# Patient Record
Sex: Male | Born: 1990 | Race: Black or African American | Hispanic: No | Marital: Single | Smoking: Never smoker
Health system: Southern US, Community
[De-identification: ages and names within clinical notes are randomized; demographics above are authoritative.]

## PROBLEM LIST (undated history)

## (undated) HISTORY — PX: HAND SURGERY: SHX662

---

## 2015-04-05 ENCOUNTER — Emergency Department (HOSPITAL_BASED_OUTPATIENT_CLINIC_OR_DEPARTMENT_OTHER): Payer: Self-pay

## 2015-04-05 ENCOUNTER — Emergency Department (HOSPITAL_BASED_OUTPATIENT_CLINIC_OR_DEPARTMENT_OTHER)
Admission: EM | Admit: 2015-04-05 | Discharge: 2015-04-05 | Disposition: A | Discharge status: Home / Self Care | Payer: Self-pay | Attending: Emergency Medicine | Admitting: Emergency Medicine

## 2015-04-05 ENCOUNTER — Encounter (HOSPITAL_BASED_OUTPATIENT_CLINIC_OR_DEPARTMENT_OTHER): Payer: Self-pay | Admitting: *Deleted

## 2015-04-05 DIAGNOSIS — Y9367 Activity, basketball: Secondary | ICD-10-CM | POA: Insufficient documentation

## 2015-04-05 DIAGNOSIS — Y92310 Basketball court as the place of occurrence of the external cause: Secondary | ICD-10-CM | POA: Insufficient documentation

## 2015-04-05 DIAGNOSIS — S99922A Unspecified injury of left foot, initial encounter: Secondary | ICD-10-CM | POA: Insufficient documentation

## 2015-04-05 DIAGNOSIS — S93401A Sprain of unspecified ligament of right ankle, initial encounter: Secondary | ICD-10-CM | POA: Insufficient documentation

## 2015-04-05 DIAGNOSIS — Y998 Other external cause status: Secondary | ICD-10-CM | POA: Insufficient documentation

## 2015-04-05 DIAGNOSIS — W1839XA Other fall on same level, initial encounter: Secondary | ICD-10-CM | POA: Insufficient documentation

## 2015-04-05 NOTE — Discharge Instructions (Signed)
Ankle Sprain °An ankle sprain is an injury to the strong, fibrous tissues (ligaments) that hold your ankle bones together.  °HOME CARE  °· Put ice on your ankle for 1-2 days or as told by your doctor. °¨ Put ice in a plastic bag. °¨ Place a towel between your skin and the bag. °¨ Leave the ice on for 15-20 minutes at a time, every 2 hours while you are awake. °· Only take medicine as told by your doctor. °· Raise (elevate) your injured ankle above the level of your heart as much as possible for 2-3 days. °· Use crutches if your doctor tells you to. Slowly put your own weight on the affected ankle. Use the crutches until you can walk without pain. °· If you have a plaster splint: °¨ Do not rest it on anything harder than a pillow for 24 hours. °¨ Do not put weight on it. °¨ Do not get it wet. °¨ Take it off to shower or bathe. °· If given, use an elastic wrap or support stocking for support. Take the wrap off if your toes lose feeling (numb), tingle, or turn cold or blue. °· If you have an air splint: °¨ Add or let out air to make it comfortable. °¨ Take it off at night and to shower and bathe. °¨ Wiggle your toes and move your ankle up and down often while you are wearing it. °GET HELP IF: °· You have rapidly increasing bruising or puffiness (swelling). °· Your toes feel very cold. °· You lose feeling in your foot. °· Your medicine does not help your pain. °GET HELP RIGHT AWAY IF:  °· Your toes lose feeling (numb) or turn blue. °· You have severe pain that is increasing. °MAKE SURE YOU:  °· Understand these instructions. °· Will watch your condition. °· Will get help right away if you are not doing well or get worse. °  °This information is not intended to replace advice given to you by your health care provider. Make sure you discuss any questions you have with your health care provider. °  °Document Released: 11/11/2007 Document Revised: 06/15/2014 Document Reviewed: 12/07/2011 °Elsevier Interactive Patient  Education ©2016 [employer] ° °

## 2015-04-05 NOTE — ED Notes (Signed)
Right foot injury playing basketball 2 hours PTA.

## 2015-04-05 NOTE — ED Provider Notes (Signed)
CSN: [id number]     Arrival date & time 04/05/15  1959 History  By signing my name below, I, Netta Corrigan, attest that this documentation has been prepared under the direction and in the presence of Linwood Dibbles, MD .  Electronically Signed: Netta Corrigan, ED Scribe. 04/05/2015. 9:05 PM.    Chief Complaint  Patient presents with  . Foot Injury   The history is provided by the patient. No language interpreter was used.   HPI Comments: Robert Oneill is a 24 y.o. male who presents to the Emergency Department complaining of constant, gradual onset, moderate pain in his left ankle and left foot beginning two hours PTA. Pt states that the pain worsens when walking. He has not tried any treatments. Pt reports he was playing basketball, jumped and landed on his right foot. He also notes that another player also landed on his foot after he fell. He denies numbness.   History reviewed. No pertinent past medical history. History reviewed. No pertinent past surgical history. History reviewed. No pertinent family history. Social History  Substance Use Topics  . Smoking status: Never Smoker   . Smokeless tobacco: None  . Alcohol Use: No    Review of Systems  Musculoskeletal: Positive for arthralgias.  Neurological: Negative for numbness.  All other systems reviewed and are negative.  Allergies  Codeine  Home Medications   Prior to Admission medications   Not on File   BP 124/77 mmHg  Pulse 92  Temp(Src) 98.6 F (37 C) (Oral)  Resp 20  Ht  (1.778 m)  Wt 145 lb (65.772 kg)  BMI 20.81 kg/m2  SpO2 99% Physical Exam  Constitutional: He appears well-developed and well-nourished. No distress.  HENT:  Head: Normocephalic and atraumatic.  Right Ear: External ear normal.  Left Ear: External ear normal.  Eyes: Conjunctivae are normal. Right eye exhibits no discharge. Left eye exhibits no discharge. No scleral icterus.  Neck: Neck supple. No tracheal deviation present.   Cardiovascular: Normal rate.   Pulmonary/Chest: Effort normal. No stridor. No respiratory distress.  Musculoskeletal: He exhibits no edema.       Right ankle: No lateral malleolus, no medial malleolus, no head of 5th metatarsal and no proximal fibula tenderness found.       Right foot: There is tenderness. There is no bony tenderness and no deformity.  Tenderness along Talofibular Ligament  Neurological: He is alert. Cranial nerve deficit: no gross deficits.  Skin: Skin is warm and dry. No rash noted.  Psychiatric: He has a normal mood and affect.  Nursing note and vitals reviewed.   ED Course  Procedures DIAGNOSTIC STUDIES: Oxygen Saturation is 100% on RA, normal by my interpretation.    9:04 PM COORDINATION OF CARE: Discussed treatment plan with pt. Pt agreed to plan.    Imaging Review Dg Foot Complete Right  04/05/2015  CLINICAL DATA:  Pain following injury while playing basketball EXAM: RIGHT FOOT COMPLETE - 3+ VIEW COMPARISON:  None. FINDINGS: Frontal, oblique, and lateral views were obtained. There is no fracture or dislocation. Joint spaces appear intact. No erosive change. IMPRESSION: No fracture or dislocation.  No appreciable arthropathy. Electronically Signed   By: Bretta Bang III M.D.   On: 04/05/2015 20:26   I have personally reviewed and evaluated these images results as part of my medical decision-making.    MDM   Final diagnoses:  Ankle sprain, right, initial encounter   Soft tissue ankle sprain.  No fx.  Splint, crutches.  Follow  up PRN or if sx do not resolve as discussed.  I personally performed the services described in this documentation, which was scribed in my presence.  The recorded information has been reviewed and is accurate.     Linwood DibblesJon Niti Leisure, MD 04/09/15 [phone]

## 2017-12-05 ENCOUNTER — Emergency Department (HOSPITAL_COMMUNITY)
Admission: EM | Admit: 2017-12-05 | Discharge: 2017-12-05 | Disposition: A | Discharge status: Home / Self Care | Payer: Self-pay | Attending: Emergency Medicine | Admitting: Emergency Medicine

## 2017-12-05 DIAGNOSIS — W57XXXA Bitten or stung by nonvenomous insect and other nonvenomous arthropods, initial encounter: Secondary | ICD-10-CM | POA: Insufficient documentation

## 2017-12-05 DIAGNOSIS — S1086XA Insect bite of other specified part of neck, initial encounter: Secondary | ICD-10-CM | POA: Insufficient documentation

## 2017-12-05 DIAGNOSIS — Y999 Unspecified external cause status: Secondary | ICD-10-CM | POA: Insufficient documentation

## 2017-12-05 DIAGNOSIS — R21 Rash and other nonspecific skin eruption: Secondary | ICD-10-CM

## 2017-12-05 DIAGNOSIS — S20469A Insect bite (nonvenomous) of unspecified back wall of thorax, initial encounter: Secondary | ICD-10-CM | POA: Insufficient documentation

## 2017-12-05 DIAGNOSIS — Y929 Unspecified place or not applicable: Secondary | ICD-10-CM | POA: Insufficient documentation

## 2017-12-05 DIAGNOSIS — S0086XA Insect bite (nonvenomous) of other part of head, initial encounter: Secondary | ICD-10-CM | POA: Insufficient documentation

## 2017-12-05 DIAGNOSIS — Y939 Activity, unspecified: Secondary | ICD-10-CM | POA: Insufficient documentation

## 2017-12-05 MED ORDER — DIPHENHYDRAMINE HCL 25 MG PO CAPS
25.0000 mg | ORAL_CAPSULE | Freq: Once | ORAL | Status: AC
  Administered 2017-12-05: 25 mg via ORAL
  Filled 2017-12-05: qty 1

## 2017-12-05 NOTE — ED Triage Notes (Signed)
Pt has rash/lumps versus bites to upper back and forehead. They itch except for the ones on the forehead.

## 2017-12-05 NOTE — ED Provider Notes (Signed)
[facility] EMERGENCY DEPARTMENT Provider Note   CSN: [id number] Arrival date & time: 12/05/17  4782     History   Chief Complaint Chief Complaint  Patient presents with  . Rash    HPI Robert Oneill is a 27 y.o. male.  Robert Oneill is a 27 y.o. Male who is otherwise healthy, presents for evaluation of itchy bumps over the forehead and upper back.  He reports these have been present for 2 to 3 days, over the past 2 days he has noticed 2 new bumps.  He reports all of them are itchy, but not as itchy as when he had scabies before.  Patient reports he has not noticed any over his arms legs or trunk.  They are minimally painful from scratching.  He has not noticed any drainage from the areas.  Denies fevers chills, generalized fatigue, myalgias, headache, nausea or vomiting.  Patient reports no one else in the home has these bites, but when he first noticed them he had been sleeping over at a friend's house.  Concern today may be bug bites but is not sure what he was bit by.  Has not been out in the woods or outdoors a lot recently.      No past medical history on file.  There are no active problems to display for this patient.   No past surgical history on file.      Home Medications    Prior to Admission medications   Not on File    Family History No family history on file.  Social History Social History   Tobacco Use  . Smoking status: Never Smoker  Substance Use Topics  . Alcohol use: No  . Drug use: No     Allergies   Codeine   Review of Systems Review of Systems  Constitutional: Negative for chills and fever.  Gastrointestinal: Negative for nausea and vomiting.  Musculoskeletal: Negative for myalgias.  Skin: Positive for rash.  Neurological: Negative for headaches.     Physical Exam Updated Vital Signs BP 118/68 (BP Location: Right Arm)   Pulse 92   Temp 98.4 F (36.9 C) (Oral)   Resp 19   Ht 5\' 10"  (1.778 m)   Wt 63.5  kg (140 lb)   SpO2 96%   BMI 20.09 kg/m   Physical Exam  Constitutional: He appears well-developed and well-nourished. No distress.  HENT:  Head: Normocephalic and atraumatic.  Eyes: Right eye exhibits no discharge. Left eye exhibits no discharge.  Pulmonary/Chest: Effort normal. No respiratory distress.  Neurological: He is alert. Coordination normal.  Skin: Skin is warm and dry. He is not diaphoretic.  Small raised bumps over the forehead, upper neck and back, there are not erythematous lesions are not vesicular or pustular, no expressible drainage, no petechiae, nontender to palpation, no fluctuance, no surrounding erythema or induration  Psychiatric: He has a normal mood and affect. His behavior is normal.  Nursing note and vitals reviewed.    ED Treatments / Results  Labs (all labs ordered are listed, but only abnormal results are displayed) Labs Reviewed - No data to display  EKG None  Radiology No results found.  Procedures Procedures (including critical care time)  Medications Ordered in ED Medications  diphenhydrAMINE (BENADRYL) capsule 25 mg (25 mg Oral Given 12/05/17 1000)     Initial Impression / Assessment and Plan / ED Course  I have reviewed the triage vital signs and the nursing notes.  Pertinent labs &  imaging results that were available during my care of the patient were reviewed by me and considered in my medical decision making (see chart for details).  Rash consistent with insect bites. Patient denies any difficulty breathing or swallowing.  Pt has a patent airway without stridor and is handling secretions without difficulty; no angioedema. No blisters, no pustules, no warmth, no draining sinus tracts, no superficial abscesses, no bullous impetigo, no vesicles, no desquamation, no target lesions with dusky purpura or a central bulla. Not tender to touch. No concern for superimposed infection. No concern for SJS, TEN, TSS, tick borne illness, syphilis or  other life-threatening condition.  Patient these are likely insect bites, may be bug since they occurred when he was sleeping in someone else's house.  Encourage patient to continue with hydrocortisone cream, Benadryl or Zyrtec as needed for pruritus.  Patient follow-up with his primary doctor if it does not improve.  Return precautions discussed.  Patient expresses understanding and is in agreement with plan.    Final Clinical Impressions(s) / ED Diagnoses   Final diagnoses:  Rash  Multiple insect bites    ED Discharge Orders    None       Dartha LodgeFord, Metta Koranda N, [place] JerseyPA-C 12/05/17 1003    Shaune PollackIsaacs, [place], MD 12/05/17 1547

## 2017-12-05 NOTE — Discharge Instructions (Signed)
This rash may be caused by some type of insect, you can continue using hydrocortisone cream and take Benadryl or Zyrtec to help with itching.  You can also use cool compresses over the area.  If they continue to show up or do not improve please follow-up with your regular doctor.  Return to the emergency department if they become larger red and swollen and begin draining fluid, you develop fevers or any other new or concerning symptoms.  As we discussed there is possibility this could be bedbugs given that your away and not in your home when this happened, this can only really be confirmed by a pest control evaluation at your friend's house where this started.

## 2019-01-09 ENCOUNTER — Encounter (HOSPITAL_BASED_OUTPATIENT_CLINIC_OR_DEPARTMENT_OTHER): Payer: Self-pay

## 2019-01-09 ENCOUNTER — Other Ambulatory Visit: Payer: Self-pay

## 2019-01-09 ENCOUNTER — Emergency Department (HOSPITAL_BASED_OUTPATIENT_CLINIC_OR_DEPARTMENT_OTHER): Payer: No Typology Code available for payment source

## 2019-01-09 ENCOUNTER — Emergency Department (HOSPITAL_BASED_OUTPATIENT_CLINIC_OR_DEPARTMENT_OTHER)
Admission: EM | Admit: 2019-01-09 | Discharge: 2019-01-09 | Disposition: A | Discharge status: Home / Self Care | Payer: No Typology Code available for payment source | Attending: Emergency Medicine | Admitting: Emergency Medicine

## 2019-01-09 DIAGNOSIS — S61411A Laceration without foreign body of right hand, initial encounter: Secondary | ICD-10-CM | POA: Diagnosis not present

## 2019-01-09 DIAGNOSIS — Y92410 Unspecified street and highway as the place of occurrence of the external cause: Secondary | ICD-10-CM | POA: Diagnosis not present

## 2019-01-09 DIAGNOSIS — S51812A Laceration without foreign body of left forearm, initial encounter: Secondary | ICD-10-CM | POA: Diagnosis not present

## 2019-01-09 DIAGNOSIS — S6991XA Unspecified injury of right wrist, hand and finger(s), initial encounter: Secondary | ICD-10-CM | POA: Diagnosis present

## 2019-01-09 DIAGNOSIS — Y9389 Activity, other specified: Secondary | ICD-10-CM | POA: Insufficient documentation

## 2019-01-09 DIAGNOSIS — Y999 Unspecified external cause status: Secondary | ICD-10-CM | POA: Diagnosis not present

## 2019-01-09 DIAGNOSIS — S61219A Laceration without foreign body of unspecified finger without damage to nail, initial encounter: Secondary | ICD-10-CM | POA: Insufficient documentation

## 2019-01-09 MED ORDER — CEPHALEXIN 250 MG PO CAPS
500.0000 mg | ORAL_CAPSULE | Freq: Once | ORAL | Status: AC
Start: 2019-01-09 — End: 2019-01-09
  Administered 2019-01-09: 15:00:00 500 mg via ORAL
  Filled 2019-01-09: qty 2

## 2019-01-09 MED ORDER — TRAMADOL HCL 50 MG PO TABS
50.0000 mg | ORAL_TABLET | Freq: Once | ORAL | Status: AC
  Administered 2019-01-09: 50 mg via ORAL
  Filled 2019-01-09: qty 1

## 2019-01-09 MED ORDER — LIDOCAINE-EPINEPHRINE (PF) 2 %-1:200000 IJ SOLN
INTRAMUSCULAR | Status: AC
  Filled 2019-01-09: qty 10

## 2019-01-09 MED ORDER — CEPHALEXIN 500 MG PO CAPS: 500.0000 mg | ORAL_CAPSULE | Freq: Three times a day (TID) | ORAL | 0 refills | Status: AC

## 2019-01-09 MED ORDER — LIDOCAINE-EPINEPHRINE (PF) 2 %-1:200000 IJ SOLN
20.0000 mL | Freq: Once | INTRAMUSCULAR | Status: AC
  Administered 2019-01-09: 20 mL
  Filled 2019-01-09: qty 20

## 2019-01-09 NOTE — Discharge Instructions (Addendum)
It was our pleasure to provide your ER care today - we hope that you feel better.  Keep wounds very clean and dry.   Take ibuprofen or acetaminophen as need for pain.  Take keflex (antibiotic) to help prevent wound infection.  Follow up with primary care doctor, or urgent care, in 10-12 days for suture removal.  Return to ER if worse, new symptoms, fevers, infection of wound, pus, spreading redness, other concern.
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
## 2022-09-21 ENCOUNTER — Other Ambulatory Visit: Payer: Self-pay

## 2022-09-21 ENCOUNTER — Encounter (HOSPITAL_BASED_OUTPATIENT_CLINIC_OR_DEPARTMENT_OTHER): Payer: Self-pay | Admitting: Emergency Medicine

## 2022-09-21 ENCOUNTER — Emergency Department (HOSPITAL_BASED_OUTPATIENT_CLINIC_OR_DEPARTMENT_OTHER)
Admission: EM | Admit: 2022-09-21 | Discharge: 2022-09-21 | Disposition: A | Discharge status: Home / Self Care | Payer: BLUE CROSS/BLUE SHIELD | Attending: Emergency Medicine | Admitting: Emergency Medicine

## 2022-09-21 ENCOUNTER — Emergency Department (HOSPITAL_BASED_OUTPATIENT_CLINIC_OR_DEPARTMENT_OTHER): Payer: BLUE CROSS/BLUE SHIELD

## 2022-09-21 DIAGNOSIS — M79641 Pain in right hand: Secondary | ICD-10-CM | POA: Diagnosis present

## 2022-09-21 DIAGNOSIS — S62356A Nondisplaced fracture of shaft of fifth metacarpal bone, right hand, initial encounter for closed fracture: Secondary | ICD-10-CM | POA: Insufficient documentation

## 2022-09-21 DIAGNOSIS — W0110XA Fall on same level from slipping, tripping and stumbling with subsequent striking against unspecified object, initial encounter: Secondary | ICD-10-CM | POA: Diagnosis not present

## 2022-09-21 DIAGNOSIS — Y9367 Activity, basketball: Secondary | ICD-10-CM | POA: Diagnosis not present

## 2022-09-21 MED ORDER — OXYCODONE HCL 5 MG PO TABS: 5.0000 mg | ORAL_TABLET | ORAL | 0 refills | Status: AC | PRN

## 2022-09-21 MED ORDER — KETOROLAC TROMETHAMINE 15 MG/ML IJ SOLN
15.0000 mg | Freq: Once | INTRAMUSCULAR | Status: AC
  Administered 2022-09-21: 15 mg via INTRAMUSCULAR
  Filled 2022-09-21: qty 1

## 2022-09-21 NOTE — ED Provider Notes (Signed)
[place] [facility] AT [facility] Provider Note   CSN: [id number] Arrival date & time: 09/21/22  1752     History Chief Complaint  Patient presents with   Hand Injury    right    Robert Oneill is a 32 y.o. male reportedly otherwise healthy presents to the emergency department for evaluation of his right hand pain.  Patient reports that he was was playing basketball and fell hitting his hand on the concrete.  He reports the incident happened on Friday.  He reports he was fine but started having some more pain last night.  He is right-hand dominant.  He has had surgery on this hand previously before.  Denies any numbness or tingling.  He denies hitting his head or any other injury.  Denies any other pain or injury to the extremity.  No blood thinner use.  Allergic to codeine.   Hand Injury Associated symptoms: no fever        Home Medications Prior to Admission medications   Medication Sig Start Date End Date Taking? Authorizing Provider  oxyCODONE (ROXICODONE) 5 MG immediate release tablet Take 1 tablet (5 mg total) by mouth every 4 (four) hours as needed for severe pain. 09/21/22  Yes Achille Rich, PA-C  cephALEXin (KEFLEX) 500 MG capsule Take 1 capsule (500 mg total) by mouth 3 (three) times daily. 01/09/19   Cathren Laine, MD      Allergies    Codeine    Review of Systems   Review of Systems  Constitutional:  Negative for chills and fever.  Musculoskeletal:  Positive for arthralgias and myalgias.    Physical Exam Updated Vital Signs BP 109/74 (BP Location: Left Arm)   Pulse (!) 109   Temp 98.2 F (36.8 C) (Oral)   Resp 18   Wt 61.2 kg   SpO2 98%   BMI 19.37 kg/m  Physical Exam Vitals and nursing note reviewed.  Constitutional:      Appearance: Normal appearance.  Eyes:     General: No scleral icterus. Pulmonary:     Effort: Pulmonary effort is normal. No respiratory distress.  Musculoskeletal:        General: Tenderness present.      Comments: Tenderness to the ulnar aspect of the right hand.  Old surgical scar present.  Tattoos present.  Otherwise, no abrasions, lacerations, fluctuance or any induration present.  Cap refill is brisk in all 5 fingers.  Palpable radial pulses.  Full flexion extension of the wrist and elbow without pain.  Patient is able to make a fist however his pinky does stick out in a flexed position at the PIP joint which he reports is chronic for him since his last surgery.  No snuffbox tenderness.  Sensation reportedly intact.  Skin:    General: Skin is dry.     Findings: No rash.  Neurological:     General: No focal deficit present.     Mental Status: He is alert. Mental status is at baseline.  Psychiatric:        Mood and Affect: Mood normal.     ED Results / Procedures / Treatments   Labs (all labs ordered are listed, but only abnormal results are displayed) Labs Reviewed - No data to display  EKG None  Radiology DG Hand Complete Right  Result Date: 09/21/2022 CLINICAL DATA:  Patient states injury playing basketball 3 days ago. Also broke that hand 18 months ago. EXAM: RIGHT HAND - COMPLETE 3+ VIEW COMPARISON:  Right hand radiographs 01/09/2019 FINDINGS: There is a nondisplaced acute transverse fracture in the midshaft of the fifth metacarpal. There is some mild bony deformity in the distal aspect of the fifth metacarpal and curvature of the bone which may related to remote prior injury. No evidence of dislocation. No additional acute findings. There is regional soft tissue swelling. IMPRESSION: 1. Nondisplaced acute transverse fracture in the midshaft of the right fifth metacarpal. 2. Mild bony deformity of the distal fifth metacarpal and curvature of the bone may be related to remote prior injury. Radiographs of the old fifth metacarpal fracture are not available for comparison. Electronically Signed   By: Emmaline Kluver M.D.   On: 09/21/2022 18:29    Procedures Procedures   Medications  Ordered in ED Medications  ketorolac (TORADOL) 15 MG/ML injection 15 mg (has no administration in time range)    ED Course/ Medical Decision Making/ A&P Clinical Course as of 09/21/22 2045  Mon Sep 21, 2022  2037 DG Hand Complete Right [RR]    Clinical Course User Index [RR] Achille Rich, PA-C    Medical Decision Making Amount and/or Complexity of Data Reviewed Radiology: ordered. Decision-making details documented in ED Course.  Risk Prescription drug management.   33 y.o. male presents to the ER today for evaluation of right hand pain. Differential diagnosis includes but is not limited to fracture, dislocation, contusion, tendon ligament injury, sprain, strain. Vital signs show mild tachycardia otherwise unremarkable. Physical exam as noted above.   X-ray of the right hand shows  1. Nondisplaced acute transverse fracture in the midshaft of the right fifth metacarpal. 2. Mild bony deformity of the distal fifth metacarpal and curvature of the bone may be related to remote prior injury. Radiographs of the old fifth metacarpal fracture are not available for comparison.  Patient already has a Hydrographic surveyor, Dr. Doroteo Glassman that he follows up with.  Will place him in a ulnar gutter and have him follow-up with him.  There is no signs of any open fracture.  He has brisk cap refill and palpable pulses.  Toradol shot given.  Will send him home with same short course of narcotic pain medication for breakthrough pain.  We discussed plan at bedside including cast care, pain management, and the need to follow-up with hand surgery. We discussed strict return precautions and red flag symptoms. The patient verbalized their understanding and agrees to the plan. The patient is stable and being discharged home in good condition.  Portions of this report may have been transcribed using voice recognition software. Every effort was made to ensure accuracy; however, inadvertent computerized transcription errors  may be present.   Final Clinical Impression(s) / ED Diagnoses Final diagnoses:  Nondisplaced fracture of shaft of fifth metacarpal bone, right hand, initial encounter for closed fracture    Rx / DC Orders ED Discharge Orders          Ordered    oxyCODONE (ROXICODONE) 5 MG immediate release tablet  Every 4 hours PRN        09/21/22 2040              Achille Rich, PA-C 09/21/22 2048    Ernie Avena, MD 09/22/22 [phone]

## 2022-09-21 NOTE — ED Triage Notes (Signed)
Right hand injury while playing basketball, swelling noted , Hx surgery same hand .

## 2022-09-21 NOTE — Discharge Instructions (Addendum)
You were seen in the ER today for evaluation of your right hand pain.  You do have a fracture in the same bone measure previous 1.  We have placed you in a splint.  Please make sure you call your hand surgeon tomorrow to schedule appointment.  I recommend taking 1000 mg of Tylenol and 600 mg of ibuprofen every 6 hours as needed for pain.  I prescribed you a few narcotic pain medications he can take for breakthrough pain.  Please do not drive or operate heavy machinery on this medication as can make you sleepy.  I included more information on cast and splint care to the discharge paperwork.  If you have any concerns, new or worsening symptoms, please return to the nearest emergency department for evaluation.  Contact a doctor if: The skin around the cast or splint gets red or raw. The skin under the cast is very itchy or painful. Your cast or splint: Gets damaged. Feels very uncomfortable. Is too tight or too loose. Your cast becomes wet or it starts to have a soft spot or area. There is a bad smell coming from under your cast. You get an object stuck under your cast. Get help right away if: You get any symptoms of compartment syndrome, such as: Very bad pain or pressure under the cast. Numbness, tingling, coldness, or pale or bluish skin. The part of your body above or below the cast is swollen, and it turns a different color (is discolored). You cannot feel or move your fingers or toes. Your pain gets worse. There is fluid leaking through the cast. You have trouble breathing or shortness of breath. You have chest pain. These symptoms may be an emergency. Get help right away. Call your local emergency services (911 in the [place] Do not wait to see if the symptoms will go away. Do not drive yourself to the hospital.

## 2022-10-06 ENCOUNTER — Ambulatory Visit: Payer: BLUE CROSS/BLUE SHIELD | Admitting: Family Medicine

## 2023-10-10 ENCOUNTER — Emergency Department (HOSPITAL_BASED_OUTPATIENT_CLINIC_OR_DEPARTMENT_OTHER)

## 2023-10-10 ENCOUNTER — Other Ambulatory Visit: Payer: Self-pay

## 2023-10-10 ENCOUNTER — Encounter (HOSPITAL_BASED_OUTPATIENT_CLINIC_OR_DEPARTMENT_OTHER): Payer: Self-pay | Admitting: Emergency Medicine

## 2023-10-10 DIAGNOSIS — W540XXA Bitten by dog, initial encounter: Secondary | ICD-10-CM | POA: Insufficient documentation

## 2023-10-10 DIAGNOSIS — Z23 Encounter for immunization: Secondary | ICD-10-CM | POA: Diagnosis not present

## 2023-10-10 DIAGNOSIS — S61452A Open bite of left hand, initial encounter: Secondary | ICD-10-CM | POA: Insufficient documentation

## 2023-10-10 DIAGNOSIS — S61052A Open bite of left thumb without damage to nail, initial encounter: Secondary | ICD-10-CM | POA: Diagnosis not present

## 2023-10-10 DIAGNOSIS — Z203 Contact with and (suspected) exposure to rabies: Secondary | ICD-10-CM | POA: Diagnosis not present

## 2023-10-10 DIAGNOSIS — S61552A Open bite of left wrist, initial encounter: Secondary | ICD-10-CM | POA: Diagnosis not present

## 2023-10-10 DIAGNOSIS — S20312A Abrasion of left front wall of thorax, initial encounter: Secondary | ICD-10-CM | POA: Diagnosis not present

## 2023-10-10 DIAGNOSIS — Z2914 Encounter for prophylactic rabies immune globin: Secondary | ICD-10-CM | POA: Diagnosis not present

## 2023-10-10 NOTE — ED Triage Notes (Signed)
 Pt reports dog bites to LUE; multiple puncture wounds noted above elbow and to wrist/hand; unknown dog; abrasions noted to LT rib area

## 2023-10-11 ENCOUNTER — Emergency Department (HOSPITAL_BASED_OUTPATIENT_CLINIC_OR_DEPARTMENT_OTHER)

## 2023-10-11 ENCOUNTER — Other Ambulatory Visit (HOSPITAL_BASED_OUTPATIENT_CLINIC_OR_DEPARTMENT_OTHER): Payer: Self-pay

## 2023-10-11 ENCOUNTER — Emergency Department (HOSPITAL_BASED_OUTPATIENT_CLINIC_OR_DEPARTMENT_OTHER)
Admission: EM | Admit: 2023-10-11 | Discharge: 2023-10-11 | Disposition: A | Discharge status: Home / Self Care | Attending: Emergency Medicine | Admitting: Emergency Medicine

## 2023-10-11 DIAGNOSIS — W540XXA Bitten by dog, initial encounter: Secondary | ICD-10-CM

## 2023-10-11 MED ORDER — RABIES VACCINE, PCEC IM SUSR
1.0000 mL | Freq: Once | INTRAMUSCULAR | Status: AC
  Administered 2023-10-11: 1 mL via INTRAMUSCULAR
  Filled 2023-10-11: qty 1

## 2023-10-11 MED ORDER — AMOXICILLIN-POT CLAVULANATE 875-125 MG PO TABS
1.0000 | ORAL_TABLET | Freq: Once | ORAL | Status: AC
  Administered 2023-10-11: 1 via ORAL
  Filled 2023-10-11: qty 1

## 2023-10-11 MED ORDER — TETANUS-DIPHTH-ACELL PERTUSSIS 5-2.5-18.5 LF-MCG/0.5 IM SUSY
0.5000 mL | PREFILLED_SYRINGE | Freq: Once | INTRAMUSCULAR | Status: AC
  Administered 2023-10-11: 0.5 mL via INTRAMUSCULAR
  Filled 2023-10-11: qty 0.5

## 2023-10-11 MED ORDER — AMOXICILLIN-POT CLAVULANATE 875-125 MG PO TABS
1.0000 | ORAL_TABLET | Freq: Two times a day (BID) | ORAL | 0 refills | Status: AC
  Filled 2023-10-11: qty 20, 10d supply, fill #0

## 2023-10-11 MED ORDER — RABIES IMMUNE GLOBULIN 150 UNIT/ML IM INJ
20.0000 [IU]/kg | INJECTION | Freq: Once | INTRAMUSCULAR | Status: AC
  Administered 2023-10-11: 1275 [IU] via INTRAMUSCULAR
  Filled 2023-10-11: qty 10

## 2023-10-11 NOTE — Discharge Instructions (Signed)
 Keep wounds clean and dry.  Take the antibiotics as prescribed.  Need to return for additional rabies vaccines on May 8, May 12 and May 19. Return to the ED sooner with worsening pain, fever, drainage, weakness, numbness, tingling, other concerns for

## 2023-10-11 NOTE — ED Provider Notes (Signed)
 [place] [facility] AT [facility] Provider Note   CSN: [id number] Arrival date & time: 10/10/23  1823     History  Chief Complaint  Patient presents with   Animal Bite    Robert Oneill is a 33 y.o. male.  Patient reports he was bitten by an unknown dog about 9 AM.  Sustained bite to his left hand, wrist, elbow.  Also abrasion to his left ribs from scratching.  Did not hit his head.  Believes it was a lab but had no collar or tags.  Denies any head, neck, back, chest or abdominal pain.  Complains of pain to his left hand, left elbow.  No focal weakness, numbness or tingling.  No chest pain or shortness of breath.  Denies any blood thinner use.  Unknown last tetanus shot.  Did not to speak with police or animal control.  Does not wish to.  The history is provided by the patient.  Animal Bite Associated symptoms: no fever        Home Medications Prior to Admission medications   Medication Sig Start Date End Date Taking? Authorizing Provider  cephALEXin  (KEFLEX ) 500 MG capsule Take 1 capsule (500 mg total) by mouth 3 (three) times daily. 01/09/19   Guadalupe Lee, MD  oxyCODONE  (ROXICODONE ) 5 MG immediate release tablet Take 1 tablet (5 mg total) by mouth every 4 (four) hours as needed for severe pain. 09/21/22   Spence Dux, PA-C      Allergies    Codeine    Review of Systems   Review of Systems  Constitutional:  Negative for activity change, appetite change and fever.  HENT:  Negative for congestion and rhinorrhea.   Respiratory:  Negative for cough, chest tightness and shortness of breath.   Cardiovascular:  Negative for chest pain.  Gastrointestinal:  Negative for abdominal pain, nausea and vomiting.  Genitourinary:  Negative for dysuria and hematuria.  Musculoskeletal:  Positive for arthralgias and myalgias.  Skin:  Positive for wound.  Neurological:  Negative for dizziness, weakness and headaches.   all other systems are negative except as noted  in the HPI and PMH.    Physical Exam Updated Vital Signs BP 121/88 (BP Location: Right Arm)   Pulse 97   Temp 98.9 F (37.2 C) (Oral)   Resp 18   Ht 5\' 9"  (1.753 m)   Wt 63.5 kg   SpO2 98%   BMI 20.67 kg/m  Physical Exam Vitals and nursing note reviewed.  Constitutional:      General: He is not in acute distress.    Appearance: He is well-developed.  HENT:     Head: Normocephalic and atraumatic.     Mouth/Throat:     Pharynx: No oropharyngeal exudate.  Eyes:     Conjunctiva/sclera: Conjunctivae normal.     Pupils: Pupils are equal, round, and reactive to light.  Neck:     Comments: No meningismus. Cardiovascular:     Rate and Rhythm: Normal rate and regular rhythm.     Heart sounds: Normal heart sounds. No murmur heard. Pulmonary:     Effort: Pulmonary effort is normal. No respiratory distress.     Breath sounds: Normal breath sounds.     Comments: Abrasion left ribs Chest:     Chest wall: Tenderness present.  Abdominal:     Palpations: Abdomen is soft.     Tenderness: There is no abdominal tenderness. There is no guarding or rebound.  Musculoskeletal:  General: Swelling, tenderness and signs of injury present.     Cervical back: Normal range of motion and neck supple.     Comments: Reduced range of motion secondary to pain.  Puncture wound to the left hypothenar eminence.  Second puncture wound to right thenar eminence.  Intact radial pulse.  Puncture wound left lateral elbow and second puncture wound left medial elbow.  There is reduced range of motion secondary to pain.  No active bleeding.  Intact radial pulse  Skin:    General: Skin is warm.  Neurological:     Mental Status: He is alert and oriented to person, place, and time.     Cranial Nerves: No cranial nerve deficit.     Motor: No abnormal muscle tone.     Coordination: Coordination normal.     Comments: No ataxia on finger to nose bilaterally. No pronator drift. 5/5 strength throughout. CN 2-12  intact.Equal grip strength. Sensation intact.   Psychiatric:        Behavior: Behavior normal.     ED Results / Procedures / Treatments   Labs (all labs ordered are listed, but only abnormal results are displayed) Labs Reviewed - No data to display  EKG None  Radiology DG Elbow Complete Left Result Date: 10/11/2023 CLINICAL DATA:  Dog bite. Multiple puncture wounds to the left arm. Left elbow pain with decreased range of motion EXAM: LEFT ELBOW - COMPLETE 3+ VIEW COMPARISON:  None Available. FINDINGS: Soft tissue gas in the posterior distal upper arm compatible with puncture wounds. No fracture or dislocation. No elbow joint effusion. IMPRESSION: Soft tissue gas in the posterior distal upper arm compatible with puncture wounds. No fracture or dislocation. Electronically Signed   By: Rozell Cornet M.D.   On: 10/11/2023 02:16   DG Ribs Unilateral W/Chest Left Result Date: 10/11/2023 CLINICAL DATA:  Recent dog bite with left-sided chest pain, initial encounter EXAM: LEFT RIBS AND CHEST - 3+ VIEW COMPARISON:  None Available. FINDINGS: Cardiac shadow is within normal limits. Lungs are clear bilaterally. No pneumothorax is seen. No rib abnormality is noted. IMPRESSION: No acute abnormality noted. Electronically Signed   By: Violeta Grey M.D.   On: 10/11/2023 01:43   DG Wrist Complete Left Result Date: 10/10/2023 CLINICAL DATA:  Dog bite EXAM: LEFT WRIST - COMPLETE 3+ VIEW COMPARISON:  None Available. FINDINGS: There is no evidence of fracture or dislocation. There is no evidence of arthropathy or other focal bone abnormality. Soft tissues are unremarkable. No radiopaque foreign body. IMPRESSION: Negative. Electronically Signed   By: Janeece Mechanic M.D.   On: 10/10/2023 20:03   DG Hand Complete Left Result Date: 10/10/2023 CLINICAL DATA:  Dog bite. EXAM: LEFT HAND - COMPLETE 3+ VIEW COMPARISON:  None Available. FINDINGS: There is no evidence of fracture or dislocation. There is no evidence of  arthropathy or other focal bone abnormality. Soft tissues are unremarkable. No foreign body identified. IMPRESSION: Negative. Electronically Signed   By: Tyron Gallon M.D.   On: 10/10/2023 20:02    Procedures Procedures    Medications Ordered in ED Medications  rabies immune globulin (HYPERRAB/KEDRAB) injection 1,275 Units (has no administration in time range)  rabies vaccine (RABAVERT) injection 1 mL (has no administration in time range)  Tdap (BOOSTRIX) injection 0.5 mL (has no administration in time range)  amoxicillin-clavulanate (AUGMENTIN) 875-125 MG per tablet 1 tablet (has no administration in time range)    ED Course/ Medical Decision Making/ A&P  Medical Decision Making Amount and/or Complexity of Data Reviewed Labs: ordered. Decision-making details documented in ED Course. Radiology: ordered and independent interpretation performed. Decision-making details documented in ED Course. ECG/medicine tests: ordered and independent interpretation performed. Decision-making details documented in ED Course.  Risk Prescription drug management.   Multiple dog bites to left hand and elbow.  Abrasion to left ribs.  No head injury.  Neurovascular intact.  Unknown last tetanus shot.  X-ray of left hand and wrist is negative for fracture or foreign body.  Results reviewed interpreted by me. Will and x-ray of left elbow and ribs.  Update tetanus.  Prophylactic antibiotics.  Rabies vaccination series after discussion of risks and benefits. He declines speaking with animal control  X-rays negative for fracture or foreign body.  Results reviewed and interpreted by me.  Rib x-rays negative.  Wounds irrigated.  Tetanus is updated.  Antibiotics initiated.  Rabies vaccination series initiated.  Discussed with patient need for follow-up for additional rabies vaccinations. He declines discussing with police or animal control.  Return precautions  discussed.        Final Clinical Impression(s) / ED Diagnoses Final diagnoses:  Dog bite, initial encounter    Rx / DC Orders ED Discharge Orders     None         Carmen Vallecillo, Mara [place], MD 10/11/23 [phone]

## 2023-10-12 ENCOUNTER — Other Ambulatory Visit (HOSPITAL_BASED_OUTPATIENT_CLINIC_OR_DEPARTMENT_OTHER): Payer: Self-pay
